# Patient Record
Sex: Male | Born: 1975 | Race: White | Hispanic: No | Marital: Single | Smoking: Current every day smoker
Health system: Southern US, Community
[De-identification: ages and names within clinical notes are randomized; demographics above are authoritative.]

## PROBLEM LIST (undated history)

## (undated) HISTORY — PX: WISDOM TOOTH EXTRACTION: SHX21

---

## 2019-02-18 ENCOUNTER — Other Ambulatory Visit: Payer: Self-pay

## 2019-02-18 DIAGNOSIS — Z20822 Contact with and (suspected) exposure to covid-19: Secondary | ICD-10-CM

## 2019-02-23 LAB — NOVEL CORONAVIRUS, NAA: SARS-CoV-2, NAA: NOT DETECTED

## 2019-04-15 ENCOUNTER — Emergency Department (HOSPITAL_COMMUNITY): Payer: 59

## 2019-04-15 ENCOUNTER — Other Ambulatory Visit: Payer: Self-pay

## 2019-04-15 ENCOUNTER — Emergency Department (HOSPITAL_COMMUNITY)
Admission: EM | Admit: 2019-04-15 | Discharge: 2019-04-15 | Disposition: A | Discharge status: Home / Self Care | Payer: 59 | Attending: Emergency Medicine | Admitting: Emergency Medicine

## 2019-04-15 ENCOUNTER — Encounter (HOSPITAL_COMMUNITY): Payer: Self-pay | Admitting: Emergency Medicine

## 2019-04-15 DIAGNOSIS — F17210 Nicotine dependence, cigarettes, uncomplicated: Secondary | ICD-10-CM | POA: Insufficient documentation

## 2019-04-15 DIAGNOSIS — R1084 Generalized abdominal pain: Secondary | ICD-10-CM | POA: Diagnosis present

## 2019-04-15 DIAGNOSIS — K529 Noninfective gastroenteritis and colitis, unspecified: Secondary | ICD-10-CM | POA: Diagnosis not present

## 2019-04-15 DIAGNOSIS — K921 Melena: Secondary | ICD-10-CM | POA: Diagnosis not present

## 2019-04-15 DIAGNOSIS — R195 Other fecal abnormalities: Secondary | ICD-10-CM

## 2019-04-15 LAB — URINALYSIS, ROUTINE W REFLEX MICROSCOPIC
Bilirubin Urine: NEGATIVE
Glucose, UA: NEGATIVE mg/dL
Hgb urine dipstick: NEGATIVE
Ketones, ur: 20 mg/dL — AB
Leukocytes,Ua: NEGATIVE
Nitrite: NEGATIVE
Protein, ur: NEGATIVE mg/dL
Specific Gravity, Urine: >1.046 — ABNORMAL HIGH (ref 1.005–1.030)
pH: 6 (ref 5.0–8.0)

## 2019-04-15 LAB — CBC WITH DIFFERENTIAL/PLATELET
Abs Immature Granulocytes: 0.04 10*3/uL (ref 0.00–0.07)
Basophils Absolute: 0.1 10*3/uL (ref 0.0–0.1)
Basophils Relative: 0 %
Eosinophils Absolute: 0.1 10*3/uL (ref 0.0–0.5)
Eosinophils Relative: 0 %
HCT: 49.8 % (ref 39.0–52.0)
Hemoglobin: 16.7 g/dL (ref 13.0–17.0)
Immature Granulocytes: 0 %
Lymphocytes Relative: 11 %
Lymphs Abs: 1.3 10*3/uL (ref 0.7–4.0)
MCH: 30.6 pg (ref 26.0–34.0)
MCHC: 33.5 g/dL (ref 30.0–36.0)
MCV: 91.2 fL (ref 80.0–100.0)
Monocytes Absolute: 0.9 10*3/uL (ref 0.1–1.0)
Monocytes Relative: 7 %
Neutro Abs: 9.6 10*3/uL — ABNORMAL HIGH (ref 1.7–7.7)
Neutrophils Relative %: 82 %
Platelets: 306 10*3/uL (ref 150–400)
RBC: 5.46 MIL/uL (ref 4.22–5.81)
RDW: 12 % (ref 11.5–15.5)
WBC: 11.9 10*3/uL — ABNORMAL HIGH (ref 4.0–10.5)
nRBC: 0 % (ref 0.0–0.2)

## 2019-04-15 LAB — COMPREHENSIVE METABOLIC PANEL
ALT: 40 U/L (ref 0–44)
AST: 50 U/L — ABNORMAL HIGH (ref 15–41)
Albumin: 4.6 g/dL (ref 3.5–5.0)
Alkaline Phosphatase: 107 U/L (ref 38–126)
Anion gap: 11 (ref 5–15)
BUN: 7 mg/dL (ref 6–20)
CO2: 26 mmol/L (ref 22–32)
Calcium: 8.7 mg/dL — ABNORMAL LOW (ref 8.9–10.3)
Chloride: 98 mmol/L (ref 98–111)
Creatinine, Ser: 0.75 mg/dL (ref 0.61–1.24)
GFR calc Af Amer: >60 mL/min (ref >60)
GFR calc non Af Amer: >60 mL/min (ref >60)
Glucose, Bld: 109 mg/dL — ABNORMAL HIGH (ref 70–99)
Potassium: 3.9 mmol/L (ref 3.5–5.1)
Sodium: 135 mmol/L (ref 135–145)
Total Bilirubin: 0.6 mg/dL (ref 0.3–1.2)
Total Protein: 8.1 g/dL (ref 6.5–8.1)

## 2019-04-15 LAB — PROTIME-INR
INR: 0.9 (ref 0.8–1.2)
Prothrombin Time: 11.7 seconds (ref 11.4–15.2)

## 2019-04-15 LAB — LIPASE, BLOOD: Lipase: 18 U/L (ref 11–51)

## 2019-04-15 LAB — POC OCCULT BLOOD, ED: Fecal Occult Bld: POSITIVE — AB

## 2019-04-15 LAB — ETHANOL: Alcohol, Ethyl (B): <10 mg/dL (ref <10)

## 2019-04-15 MED ORDER — METRONIDAZOLE 500 MG PO TABS: 500.0000 mg | ORAL_TABLET | Freq: Three times a day (TID) | ORAL | 0 refills | Status: DC

## 2019-04-15 MED ORDER — IOHEXOL 300 MG/ML  SOLN
100.0000 mL | Freq: Once | INTRAMUSCULAR | Status: AC | PRN
  Administered 2019-04-15: 10:00:00 100 mL via INTRAVENOUS

## 2019-04-15 MED ORDER — CIPROFLOXACIN HCL 250 MG PO TABS
500.0000 mg | ORAL_TABLET | Freq: Once | ORAL | Status: AC
  Administered 2019-04-15: 14:00:00 500 mg via ORAL
  Filled 2019-04-15: qty 2

## 2019-04-15 MED ORDER — METRONIDAZOLE 500 MG PO TABS
500.0000 mg | ORAL_TABLET | Freq: Once | ORAL | Status: AC
  Administered 2019-04-15: 14:00:00 500 mg via ORAL
  Filled 2019-04-15: qty 1

## 2019-04-15 MED ORDER — PANTOPRAZOLE SODIUM 40 MG IV SOLR
40.0000 mg | Freq: Once | INTRAVENOUS | Status: AC
  Administered 2019-04-15: 09:00:00 40 mg via INTRAVENOUS
  Filled 2019-04-15: qty 40

## 2019-04-15 MED ORDER — ONDANSETRON HCL 4 MG/2ML IJ SOLN
4.0000 mg | INTRAMUSCULAR | Status: DC | PRN
  Administered 2019-04-15: 4 mg via INTRAVENOUS
  Filled 2019-04-15: qty 2

## 2019-04-15 MED ORDER — PROMETHAZINE HCL 25 MG PO TABS: 25.0000 mg | ORAL_TABLET | Freq: Four times a day (QID) | ORAL | 0 refills | Status: DC | PRN

## 2019-04-15 MED ORDER — SODIUM CHLORIDE 0.9 % IV BOLUS
1000.0000 mL | Freq: Once | INTRAVENOUS | Status: AC
  Administered 2019-04-15: 1000 mL via INTRAVENOUS

## 2019-04-15 MED ORDER — MORPHINE SULFATE (PF) 4 MG/ML IV SOLN
4.0000 mg | INTRAVENOUS | Status: AC | PRN
  Administered 2019-04-15 (×2): 4 mg via INTRAVENOUS
  Filled 2019-04-15 (×2): qty 1

## 2019-04-15 MED ORDER — CIPROFLOXACIN HCL 500 MG PO TABS: 500.0000 mg | ORAL_TABLET | Freq: Two times a day (BID) | ORAL | 0 refills | Status: DC

## 2019-04-15 NOTE — Discharge Instructions (Addendum)
Take the prescriptions as directed.  Increase your fluid intake (ie:  Gatoraide) for the next few days, as discussed.  Eat a bland diet and advance to your regular diet slowly as you can tolerate it.   Avoid full strength juices, as well as milk and milk products until your diarrhea has resolved.   Call your regular medical doctor today to schedule a follow up appointment in the next 3 days. Call the GI doctor today to schedule a follow up appointment within the next week.  Return to the Emergency Department immediately sooner if worsening.

## 2019-04-15 NOTE — ED Provider Notes (Signed)
[facility] EMERGENCY DEPARTMENT Provider Note   CSN: [id number] Arrival date & time: 04/15/19  [id number]     History   Chief Complaint Chief Complaint  Patient presents with   Abdominal Pain    HPI Gabriel Cruz is a 43 y.o. male.     HPI Pt was seen at 0840. Per pt, c/o gradual onset and persistence of constant generalized abd "pain" that began 3 days ago. Has been associated with multiple intermittent episodes of N/V/D that began today. Has been associated with decreased appetite. Describes the stools as "black."  Endorses hx of daily etoh use. Denies CP/SOB, no back pain, no fevers, no black or blood in emesis, no blood in stools.     History reviewed. No pertinent past medical history.  There are no active problems to display for this patient.   History reviewed. No pertinent surgical history.      Home Medications    Prior to Admission medications   Not on File    Family History History reviewed. No pertinent family history.  Social History Social History   Tobacco Use   Smoking status: Current Every Day Smoker    Packs/day: 1.00    Types: Cigarettes   Smokeless tobacco: Never Used  Substance Use Topics   Alcohol use: Yes    Comment: daily beer   Drug use: Yes    Types: Marijuana    Comment: occasional     Allergies   Patient has no known allergies.   Review of Systems Review of Systems ROS: Statement: All systems negative except as marked or noted in the HPI; Constitutional: Negative for fever and chills. ; ; Eyes: Negative for eye pain, redness and discharge. ; ; ENMT: Negative for ear pain, hoarseness, nasal congestion, sinus pressure and sore throat. ; ; Cardiovascular: Negative for chest pain, palpitations, diaphoresis, dyspnea and peripheral edema. ; ; Respiratory: Negative for cough, wheezing and stridor. ; ; Gastrointestinal: +N/V/D, abd pain, "black stools." Negative for blood in stool, hematemesis, jaundice and rectal bleeding. . ;  ; Genitourinary: Negative for dysuria, flank pain and hematuria. ; ; Genital:  No penile drainage or rash, no testicular pain or swelling, no scrotal rash or swelling. ;; Musculoskeletal: Negative for back pain and neck pain. Negative for swelling and trauma.; ; Skin: Negative for pruritus, rash, abrasions, blisters, bruising and skin lesion.; ; Neuro: Negative for headache, lightheadedness and neck stiffness. Negative for weakness, altered level of consciousness, altered mental status, extremity weakness, paresthesias, involuntary movement, seizure and syncope.       Physical Exam Updated Vital Signs BP (!) 145/86    Pulse 88    Resp 18    Ht 6\' 1"  (1.854 m)    Wt 74.8 kg    SpO2 100%    BMI 21.77 kg/m    BP (!) 155/89    Pulse 84    Temp 97.9 F (36.6 C) (Oral)    Resp 14    Ht 6\' 1"  (1.854 m)    Wt 74.8 kg    SpO2 94%    BMI 21.77 kg/m    Physical Exam 0845: Physical examination:  Nursing notes reviewed; Vital signs and O2 SAT reviewed;  Constitutional: Well developed, Well nourished, Well hydrated, Uncomfortable appearing; Head:  Normocephalic, atraumatic; Eyes: EOMI, PERRL, No scleral icterus; ENMT: Mouth and pharynx normal, Mucous membranes moist; Neck: Supple, Full range of motion, No lymphadenopathy; Cardiovascular: Regular rate and rhythm, No gallop; Respiratory: Breath sounds clear & equal bilaterally, No  wheezes.  Speaking full sentences with ease, Normal respiratory effort/excursion; Chest: Nontender, Movement normal; Abdomen: Soft, +RLQ > RUQ, mid-epigastric tenderness to palp. Nondistended, Normal bowel sounds. Rectal exam performed w/permission of pt and ED RN chaperone present.  Anal tone normal.  Non-tender, scant black stool in rectal vault, heme positive.  No fissures, no external hemorrhoids, no palp masses.;;; Genitourinary: No CVA tenderness; Extremities: Peripheral pulses normal, No tenderness, No edema, No calf edema or asymmetry.; Neuro: AA&Ox3, Major CN grossly intact.   Speech clear. No gross focal motor or sensory deficits in extremities.; Skin: Color normal, Warm, Dry.     ED Treatments / Results  Labs (all labs ordered are listed, but only abnormal results are displayed)   EKG None  Radiology   Procedures Procedures (including critical care time)  Medications Ordered in ED Medications  ondansetron (ZOFRAN) injection 4 mg (4 mg Intravenous Given 04/15/19 0856)  morphine 4 MG/ML injection 4 mg (4 mg Intravenous Given 04/15/19 0859)  sodium chloride 0.9 % bolus 1,000 mL (1,000 mLs Intravenous New Bag/Given 04/15/19 0900)  pantoprazole (PROTONIX) injection 40 mg (40 mg Intravenous Given 04/15/19 0856)     Initial Impression / Assessment and Plan / ED Course  I have reviewed the triage vital signs and the nursing notes.  Pertinent labs & imaging results that were available during my care of the patient were reviewed by me and considered in my medical decision making (see chart for details).     MDM Reviewed: previous chart, nursing note and vitals Reviewed previous: labs Interpretation: labs, x-ray and CT scan   Results for orders placed or performed during the hospital encounter of 04/15/19  Protime-INR  Result Value Ref Range   Prothrombin Time 11.7 11.4 - 15.2 seconds   INR 0.9 0.8 - 1.2  CBC with Differential  Result Value Ref Range   WBC 11.9 (H) 4.0 - 10.5 K/uL   RBC 5.46 4.22 - 5.81 MIL/uL   Hemoglobin 16.7 13.0 - 17.0 g/dL   HCT 16.149.8 09.639.0 - 04.552.0 %   MCV 91.2 80.0 - 100.0 fL   MCH 30.6 26.0 - 34.0 pg   MCHC 33.5 30.0 - 36.0 g/dL   RDW 40.912.0 81.111.5 - 91.415.5 %   Platelets 306 150 - 400 K/uL   nRBC 0.0 0.0 - 0.2 %   Neutrophils Relative % 82 %   Neutro Abs 9.6 (H) 1.7 - 7.7 K/uL   Lymphocytes Relative 11 %   Lymphs Abs 1.3 0.7 - 4.0 K/uL   Monocytes Relative 7 %   Monocytes Absolute 0.9 0.1 - 1.0 K/uL   Eosinophils Relative 0 %   Eosinophils Absolute 0.1 0.0 - 0.5 K/uL   Basophils Relative 0 %   Basophils Absolute 0.1 0.0  - 0.1 K/uL   Immature Granulocytes 0 %   Abs Immature Granulocytes 0.04 0.00 - 0.07 K/uL  Comprehensive metabolic panel  Result Value Ref Range   Sodium 135 135 - 145 mmol/L   Potassium 3.9 3.5 - 5.1 mmol/L   Chloride 98 98 - 111 mmol/L   CO2 26 22 - 32 mmol/L   Glucose, Bld 109 (H) 70 - 99 mg/dL   BUN 7 6 - 20 mg/dL   Creatinine, Ser 7.820.75 0.61 - 1.24 mg/dL   Calcium 8.7 (L) 8.9 - 10.3 mg/dL   Total Protein 8.1 6.5 - 8.1 g/dL   Albumin 4.6 3.5 - 5.0 g/dL   AST 50 (H) 15 - 41 U/L   ALT 40 0 -  44 U/L   Alkaline Phosphatase 107 38 - 126 U/L   Total Bilirubin 0.6 0.3 - 1.2 mg/dL   GFR calc non Af Amer >60 >60 mL/min   GFR calc Af Amer >60 >60 mL/min   Anion gap 11 5 - 15  Ethanol  Result Value Ref Range   Alcohol, Ethyl (B) <10 <10 mg/dL  Lipase, blood  Result Value Ref Range   Lipase 18 11 - 51 U/L  Urinalysis, Routine w reflex microscopic  Result Value Ref Range   Color, Urine YELLOW YELLOW   APPearance CLEAR CLEAR   Specific Gravity, Urine >1.046 (H) 1.005 - 1.030   pH 6.0 5.0 - 8.0   Glucose, UA NEGATIVE NEGATIVE mg/dL   Hgb urine dipstick NEGATIVE NEGATIVE   Bilirubin Urine NEGATIVE NEGATIVE   Ketones, ur 20 (A) NEGATIVE mg/dL   Protein, ur NEGATIVE NEGATIVE mg/dL   Nitrite NEGATIVE NEGATIVE   Leukocytes,Ua NEGATIVE NEGATIVE  POC occult blood, ED  Result Value Ref Range   Fecal Occult Bld POSITIVE (A) NEGATIVE   Dg Chest 2 View Result Date: 04/15/2019 CLINICAL DATA:  Abdominal pain, cough, left-sided chest pain EXAM: CHEST - 2 VIEW COMPARISON:  None. FINDINGS: The heart size and mediastinal contours are within normal limits. Both lungs are clear. The visualized skeletal structures are unremarkable. IMPRESSION: No acute abnormality of the lungs. Electronically Signed   By: Eddie Candle M.D.   On: 04/15/2019 10:33   Ct Abdomen Pelvis W Contrast Result Date: 04/15/2019 CLINICAL DATA:  Acute generalized abdominal pain. EXAM: CT ABDOMEN AND PELVIS WITH CONTRAST TECHNIQUE:  Multidetector CT imaging of the abdomen and pelvis was performed using the standard protocol following bolus administration of intravenous contrast. CONTRAST:  117mL OMNIPAQUE IOHEXOL 300 MG/ML  SOLN COMPARISON:  None. FINDINGS: Lower chest: No acute abnormality. Hepatobiliary: No focal liver abnormality is seen. No gallstones, gallbladder wall thickening, or biliary dilatation. Pancreas: Unremarkable. No pancreatic ductal dilatation or surrounding inflammatory changes. Spleen: Normal in size without focal abnormality. Adrenals/Urinary Tract: Adrenal glands are unremarkable. Kidneys are normal, without renal calculi, focal lesion, or hydronephrosis. Bladder is unremarkable. Stomach/Bowel: The stomach and appendix are unremarkable. There is diffuse wall and fold thickening throughout the colon, most prominently the cecum, with minimal surrounding inflammatory changes consistent with infectious or inflammatory colitis. Vascular/Lymphatic: No significant vascular findings are present. No enlarged abdominal or pelvic lymph nodes. Reproductive: Prostate is unremarkable. Other: No abdominal wall hernia or abnormality. No abdominopelvic ascites. Musculoskeletal: No acute or significant osseous findings. IMPRESSION: Diffuse wall and fold thickening is noted throughout the colon, most prominently involving the cecum, consistent with infectious or inflammatory colitis. Electronically Signed   By: Marijo Conception M.D.   On: 04/15/2019 10:37    Gabriel Cruz was evaluated in Emergency Department on 04/15/2019 for the symptoms described in the history of present illness. He was evaluated in the context of the global COVID-19 pandemic, which necessitated consideration that the patient might be at risk for infection with the SARS-CoV-2 virus that causes COVID-19. Institutional protocols and algorithms that pertain to the evaluation of patients at risk for COVID-19 are in a state of rapid change based on information released by  regulatory bodies including the CDC and federal and state organizations. These policies and algorithms were followed during the patient's care in the ED.   1315:  Pt has tol PO well while in the ED without N/V.  No stooling while in the ED.  Abd benign, resps easy, VSS. Feels better  and wants to go home now. Pt feels he can "drink more fluids" at home vs more IVF in the ED. Strict return precautions given. Dx and testing, d/w pt.  Questions answered.  Verb understanding, agreeable to d/c home with outpt f/u.      Final Clinical Impressions(s) / ED Diagnoses   Final diagnoses:  None    ED Discharge Orders    None       Samuel Jester, DO 04/18/19 1537

## 2019-04-15 NOTE — ED Triage Notes (Signed)
Pt states he has been having generalized abd pain with nausea, vomiting, and diarrhea x 3 days. No other individuals sick. ETOH daily.

## 2020-02-21 IMAGING — DX CHEST - 2 VIEW
2 series · 2 of 2 positions shown · non-contrast
Comparison: None.

CLINICAL DATA: Abdominal pain, cough, left-sided chest pain

EXAM:
CHEST - 2 VIEW

[chest pa]
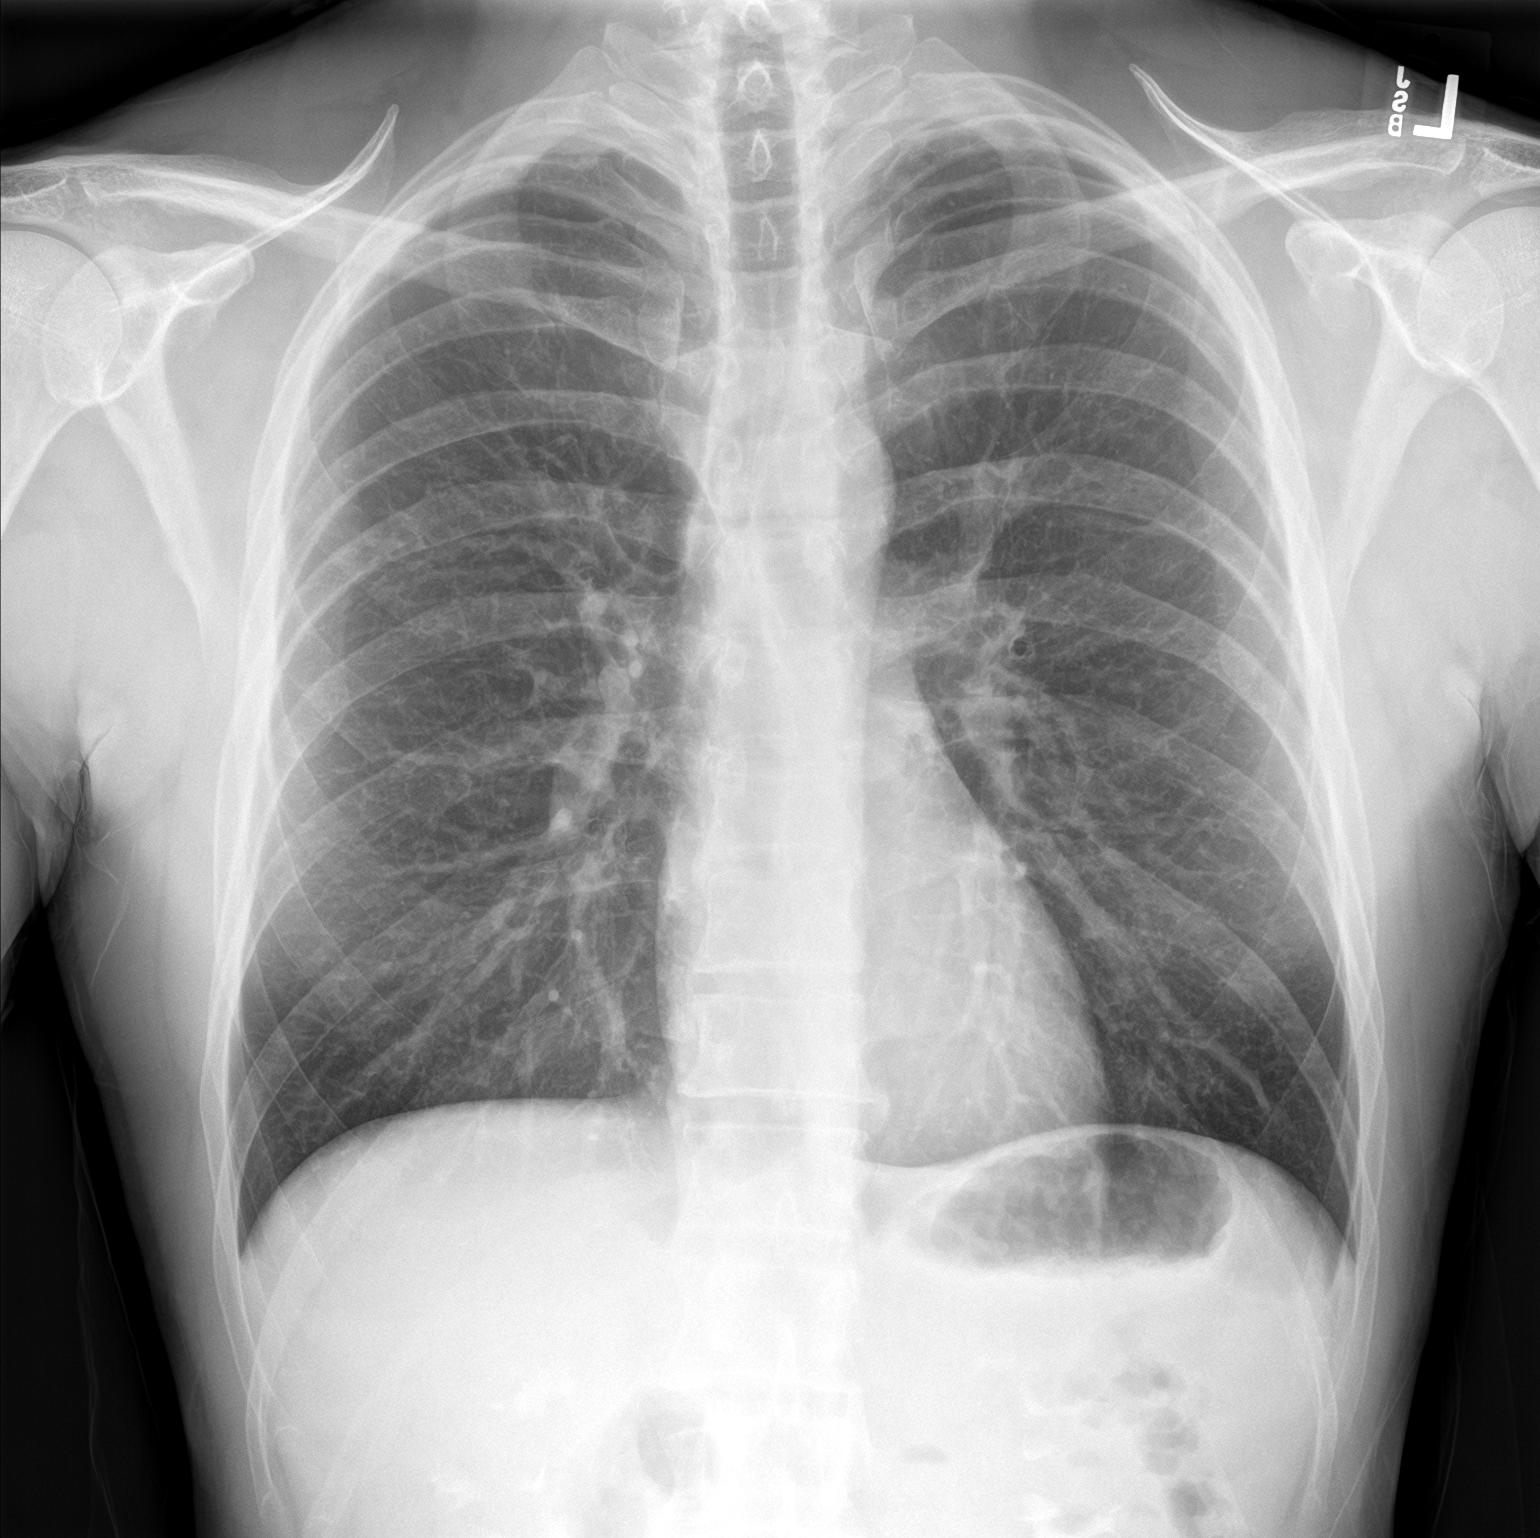

[chest lat]
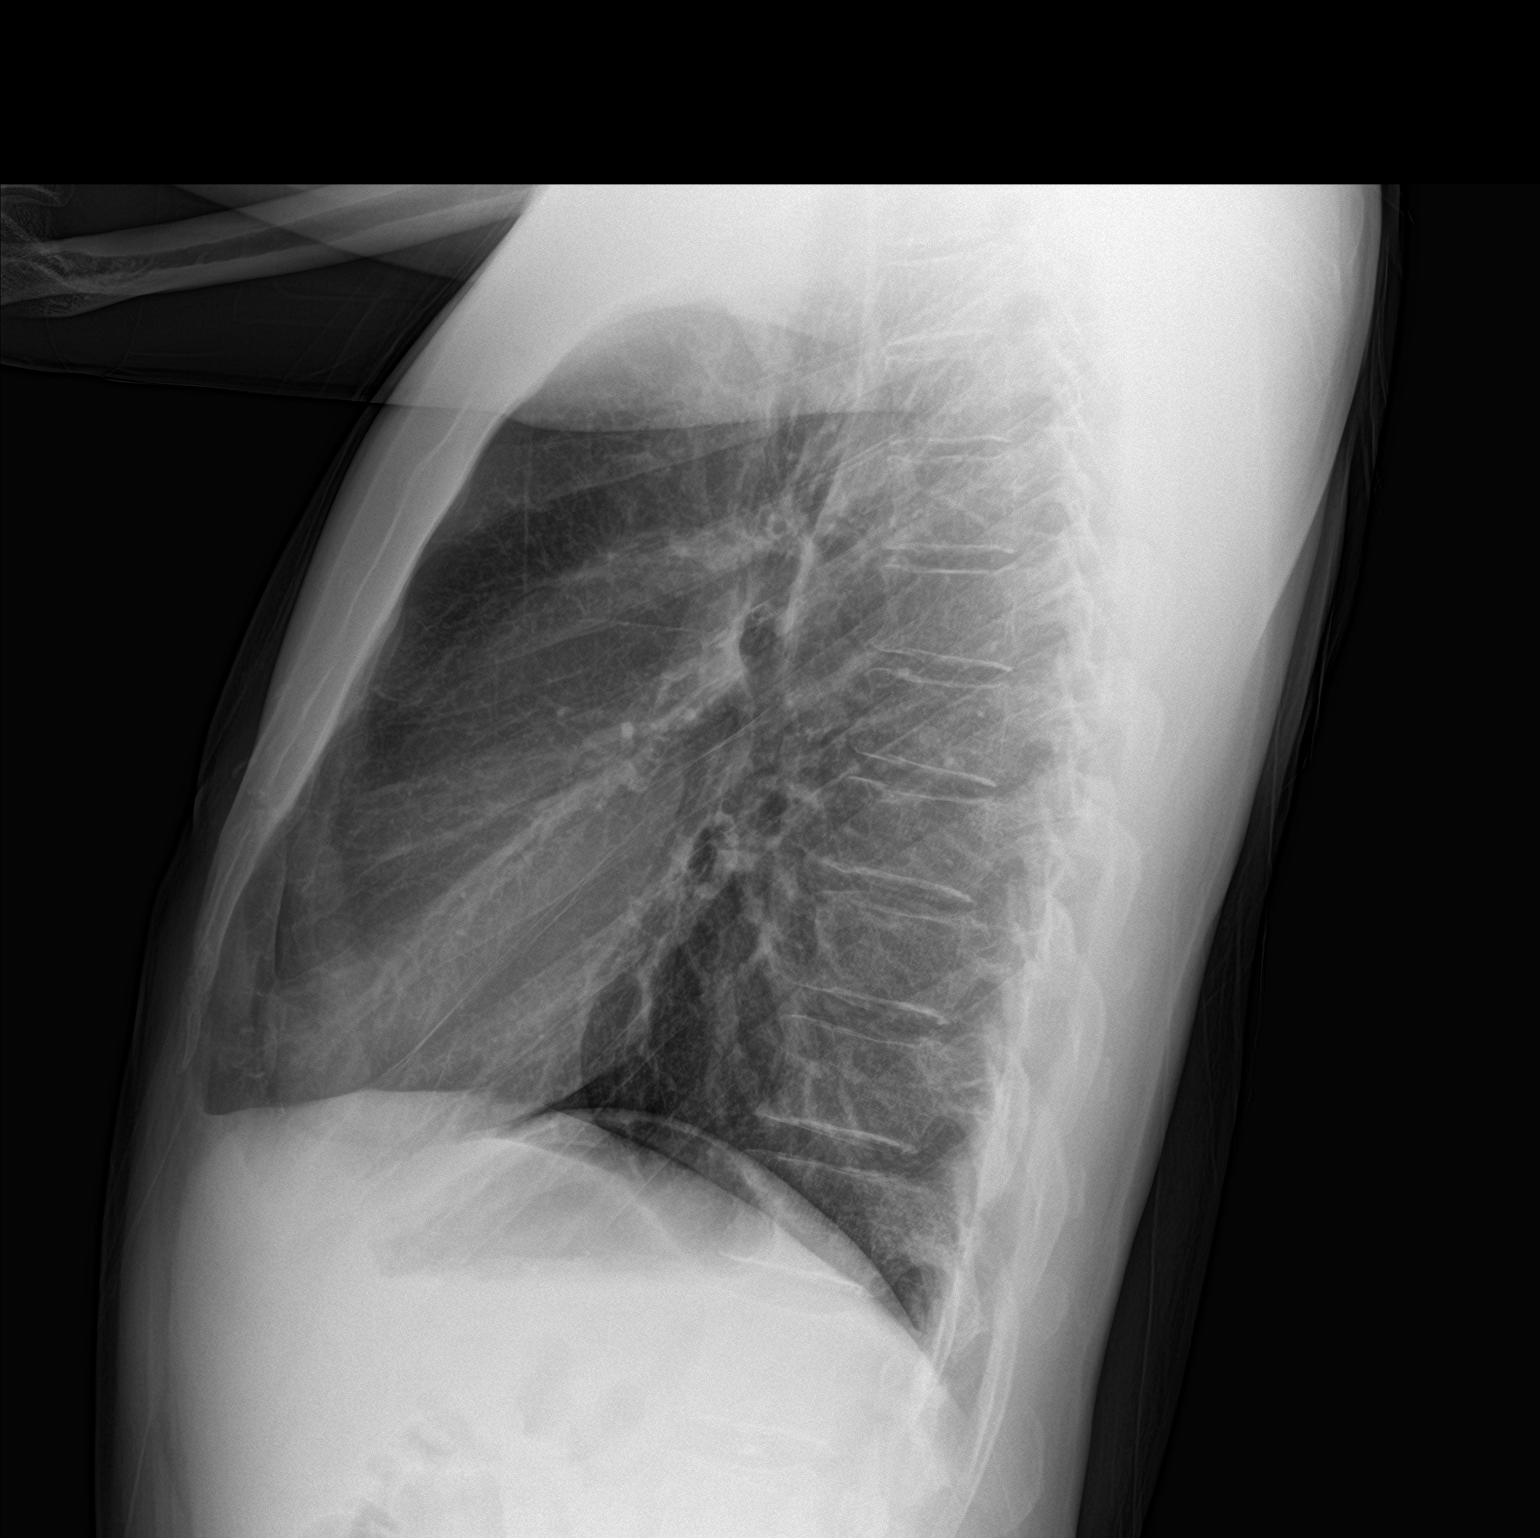

[2 of 2 positions shown; findings below may reference images not displayed]

FINDINGS: The heart size and mediastinal contours are within normal limits.
Both lungs are clear. The visualized skeletal structures are
unremarkable.
IMPRESSION: No acute abnormality of the lungs.

## 2023-12-18 ENCOUNTER — Encounter: Payer: Self-pay | Admitting: *Deleted

## 2024-01-22 ENCOUNTER — Other Ambulatory Visit: Payer: Self-pay | Admitting: *Deleted

## 2024-01-22 DIAGNOSIS — Z1211 Encounter for screening for malignant neoplasm of colon: Secondary | ICD-10-CM

## 2024-01-23 ENCOUNTER — Ambulatory Visit (INDEPENDENT_AMBULATORY_CARE_PROVIDER_SITE_OTHER): Payer: MEDICAID | Admitting: General Surgery

## 2024-01-23 ENCOUNTER — Encounter: Payer: Self-pay | Admitting: General Surgery

## 2024-01-23 VITALS — BP 122/80 | HR 82 | Temp 98.1°F | Resp 16 | Ht 73.0 in | Wt 160.0 lb

## 2024-01-23 DIAGNOSIS — Z1211 Encounter for screening for malignant neoplasm of colon: Secondary | ICD-10-CM | POA: Diagnosis not present

## 2024-01-23 MED ORDER — SUTAB 1479-225-188 MG PO TABS: 24.0000 | ORAL_TABLET | Freq: Once | ORAL | 0 refills | Status: AC

## 2024-01-23 NOTE — Progress Notes (Signed)
 Gabriel Cruz; [id number]; Mar 13, 1976   HPI Patient is a 48 year old white male who was referred to my care by Dr. Abel Abelson for a screening colonoscopy.  Patient has never had a colonoscopy.  He has no immediate relatives with colon cancer, but distant relatives have been diagnosed with colon cancer.  He denies any abdominal pain, abnormal weight gain or loss, blood per rectum, or abnormal diarrhea or constipation. History reviewed. No pertinent past medical history.  History reviewed. No pertinent surgical history.  History reviewed. No pertinent family history.  No current outpatient medications on file prior to visit.   No current facility-administered medications on file prior to visit.    No Known Allergies  Social History   Substance and Sexual Activity  Alcohol Use Not Currently   Comment: daily beer    Social History   Tobacco Use  Smoking Status Every Day   Current packs/day: 1.00   Types: Cigarettes  Smokeless Tobacco Never    Review of Systems  Constitutional: Negative.   HENT: Negative.    Eyes:  Positive for blurred vision.  Respiratory: Negative.    Cardiovascular: Negative.   Gastrointestinal: Negative.   Genitourinary: Negative.   Musculoskeletal: Negative.   Skin: Negative.   Neurological: Negative.   Endo/Heme/Allergies: Negative.   Psychiatric/Behavioral: Negative.      Objective   Vitals:   01/23/24 1331  BP: 122/80  Pulse: 82  Resp: 16  Temp: 98.1 F (36.7 C)  SpO2: 94%    Physical Exam Vitals reviewed.  Constitutional:      Appearance: Normal appearance. He is normal weight. He is not ill-appearing.  HENT:     Head: Normocephalic and atraumatic.  Cardiovascular:     Rate and Rhythm: Normal rate and regular rhythm.     Heart sounds: Normal heart sounds. No murmur heard.    No friction rub. No gallop.  Pulmonary:     Effort: Pulmonary effort is normal. No respiratory distress.     Breath sounds: Normal breath sounds. No  stridor. No wheezing, rhonchi or rales.  Abdominal:     General: Abdomen is flat. Bowel sounds are normal. There is no distension.     Palpations: Abdomen is soft. There is no mass.     Tenderness: There is no abdominal tenderness. There is no guarding or rebound.     Hernia: No hernia is present.  Skin:    General: Skin is warm and dry.  Neurological:     Mental Status: He is alert and oriented to person, place, and time.   Primary care notes reviewed  Assessment  Need for screening colonoscopy Plan  Patient is scheduled for screening colonoscopy on 02/13/2024.  The risks and benefits of the procedure including bleeding and perforation were fully explained to the patient, who gave informed consent.  Sutabs have been prescribed preoperatively for bowel preparation.

## 2024-01-23 NOTE — H&P (Signed)
 Gabriel Cruz; [id number]; Mar 13, 1976   HPI Patient is a 48 year old white male who was referred to my care by Dr. Abel Abelson for a screening colonoscopy.  Patient has never had a colonoscopy.  He has no immediate relatives with colon cancer, but distant relatives have been diagnosed with colon cancer.  He denies any abdominal pain, abnormal weight gain or loss, blood per rectum, or abnormal diarrhea or constipation. History reviewed. No pertinent past medical history.  History reviewed. No pertinent surgical history.  History reviewed. No pertinent family history.  No current outpatient medications on file prior to visit.   No current facility-administered medications on file prior to visit.    No Known Allergies  Social History   Substance and Sexual Activity  Alcohol Use Not Currently   Comment: daily beer    Social History   Tobacco Use  Smoking Status Every Day   Current packs/day: 1.00   Types: Cigarettes  Smokeless Tobacco Never    Review of Systems  Constitutional: Negative.   HENT: Negative.    Eyes:  Positive for blurred vision.  Respiratory: Negative.    Cardiovascular: Negative.   Gastrointestinal: Negative.   Genitourinary: Negative.   Musculoskeletal: Negative.   Skin: Negative.   Neurological: Negative.   Endo/Heme/Allergies: Negative.   Psychiatric/Behavioral: Negative.      Objective   Vitals:   01/23/24 1331  BP: 122/80  Pulse: 82  Resp: 16  Temp: 98.1 F (36.7 C)  SpO2: 94%    Physical Exam Vitals reviewed.  Constitutional:      Appearance: Normal appearance. He is normal weight. He is not ill-appearing.  HENT:     Head: Normocephalic and atraumatic.  Cardiovascular:     Rate and Rhythm: Normal rate and regular rhythm.     Heart sounds: Normal heart sounds. No murmur heard.    No friction rub. No gallop.  Pulmonary:     Effort: Pulmonary effort is normal. No respiratory distress.     Breath sounds: Normal breath sounds. No  stridor. No wheezing, rhonchi or rales.  Abdominal:     General: Abdomen is flat. Bowel sounds are normal. There is no distension.     Palpations: Abdomen is soft. There is no mass.     Tenderness: There is no abdominal tenderness. There is no guarding or rebound.     Hernia: No hernia is present.  Skin:    General: Skin is warm and dry.  Neurological:     Mental Status: He is alert and oriented to person, place, and time.   Primary care notes reviewed  Assessment  Need for screening colonoscopy Plan  Patient is scheduled for screening colonoscopy on 02/13/2024.  The risks and benefits of the procedure including bleeding and perforation were fully explained to the patient, who gave informed consent.  Sutabs have been prescribed preoperatively for bowel preparation.

## 2024-02-13 ENCOUNTER — Ambulatory Visit (HOSPITAL_COMMUNITY): Payer: MEDICAID

## 2024-02-13 ENCOUNTER — Other Ambulatory Visit: Payer: Self-pay

## 2024-02-13 ENCOUNTER — Ambulatory Visit (HOSPITAL_COMMUNITY)
Admission: RE | Admit: 2024-02-13 | Discharge: 2024-02-13 | Disposition: A | Discharge status: Home / Self Care | Payer: MEDICAID | Attending: General Surgery | Admitting: General Surgery

## 2024-02-13 ENCOUNTER — Encounter (HOSPITAL_COMMUNITY)
Admission: RE | Disposition: A | Discharge status: Home / Self Care | Payer: Self-pay | Source: Home / Self Care | Attending: General Surgery

## 2024-02-13 ENCOUNTER — Encounter (HOSPITAL_COMMUNITY): Payer: Self-pay | Admitting: General Surgery

## 2024-02-13 DIAGNOSIS — Z1211 Encounter for screening for malignant neoplasm of colon: Secondary | ICD-10-CM | POA: Diagnosis not present

## 2024-02-13 DIAGNOSIS — F17210 Nicotine dependence, cigarettes, uncomplicated: Secondary | ICD-10-CM | POA: Diagnosis not present

## 2024-02-13 DIAGNOSIS — H538 Other visual disturbances: Secondary | ICD-10-CM | POA: Diagnosis not present

## 2024-02-13 HISTORY — PX: COLONOSCOPY: SHX5424

## 2024-02-13 SURGERY — COLONOSCOPY
Anesthesia: General

## 2024-02-13 MED ORDER — LACTATED RINGERS IV SOLN: INTRAVENOUS | Status: DC

## 2024-02-13 MED ORDER — PHENYLEPHRINE 80 MCG/ML (10ML) SYRINGE FOR IV PUSH (FOR BLOOD PRESSURE SUPPORT)
PREFILLED_SYRINGE | INTRAVENOUS | Status: DC | PRN
Start: 2024-02-13 — End: 2024-02-13
  Administered 2024-02-13 (×4): 80 ug via INTRAVENOUS

## 2024-02-13 MED ORDER — LIDOCAINE 2% (20 MG/ML) 5 ML SYRINGE
INTRAMUSCULAR | Status: DC | PRN
  Administered 2024-02-13: 60 mg via INTRAVENOUS

## 2024-02-13 MED ORDER — PROPOFOL 10 MG/ML IV BOLUS
INTRAVENOUS | Status: DC | PRN
  Administered 2024-02-13: 30 mg via INTRAVENOUS
  Administered 2024-02-13: 80 mg via INTRAVENOUS
  Administered 2024-02-13: 150 ug/kg/min via INTRAVENOUS

## 2024-02-13 NOTE — Transfer of Care (Signed)
 Immediate Anesthesia Transfer of Care Note  Patient: Ludie Pavlik  Procedure(s) Performed: COLONOSCOPY  Patient Location: Endoscopy Unit  Anesthesia Type:General  Level of Consciousness: drowsy and patient cooperative  Airway & Oxygen Therapy: Patient Spontanous Breathing  Post-op Assessment: Report given to RN and Post -op Vital signs reviewed and stable  Post vital signs: Reviewed and stable  Last Vitals:  Vitals Value Taken Time  BP 110/51 02/13/24 07:46  Temp 36.7 C 02/13/24 07:46  Pulse 75 02/13/24 07:46  Resp 14 02/13/24 07:46  SpO2 96 % 02/13/24 07:46    Last Pain:  Vitals:   02/13/24 0746  TempSrc: Axillary  PainSc: 0-No pain      Patients Stated Pain Goal: 10 (02/13/24 0706)  Complications: No notable events documented.

## 2024-02-13 NOTE — Interval H&P Note (Signed)
 History and Physical Interval Note:  02/13/2024 7:08 AM  Gabriel Cruz  has presented today for surgery, with the diagnosis of SCREENING FOR COLON CANCER.  The various methods of treatment have been discussed with the patient and family. After consideration of risks, benefits and other options for treatment, the patient has consented to  Procedure(s) with comments: COLONOSCOPY (N/A) - W/ PROPOFOL as a surgical intervention.  The patient's history has been reviewed, patient examined, no change in status, stable for surgery.  I have reviewed the patient's chart and labs.  Questions were answered to the patient's satisfaction.     Oneil Budge

## 2024-02-13 NOTE — Anesthesia Preprocedure Evaluation (Addendum)
 Anesthesia Evaluation  Patient identified by MRN, date of birth, ID band Patient awake    Reviewed: Allergy & Precautions, H&P , NPO status , Patient's Chart, lab work & pertinent test results  Airway Mallampati: II  TM Distance: >3 FB Neck ROM: Full    Dental no notable dental hx.    Pulmonary Current Smoker   Pulmonary exam normal breath sounds clear to auscultation       Cardiovascular negative cardio ROS Normal cardiovascular exam Rhythm:Regular Rate:Normal     Neuro/Psych negative neurological ROS  negative psych ROS   GI/Hepatic negative GI ROS, Neg liver ROS,,,  Endo/Other  negative endocrine ROS    Renal/GU negative Renal ROS  negative genitourinary   Musculoskeletal negative musculoskeletal ROS (+)    Abdominal   Peds negative pediatric ROS (+)  Hematology negative hematology ROS (+)   Anesthesia Other Findings   Reproductive/Obstetrics negative OB ROS                             Anesthesia Physical Anesthesia Plan  ASA: 2  Anesthesia Plan: General   Post-op Pain Management:    Induction: Intravenous  PONV Risk Score and Plan: Propofol infusion  Airway Management Planned: Nasal Cannula  Additional Equipment:   Intra-op Plan:   Post-operative Plan:   Informed Consent: I have reviewed the patients History and Physical, chart, labs and discussed the procedure including the risks, benefits and alternatives for the proposed anesthesia with the patient or authorized representative who has indicated his/her understanding and acceptance.     Dental advisory given  Plan Discussed with: CRNA  Anesthesia Plan Comments:        Anesthesia Quick Evaluation

## 2024-02-13 NOTE — Op Note (Signed)
 [facility] Patient Name: Gabriel Cruz Procedure Date: 02/13/2024 7:14 AM MRN: [id number] Date of Birth: [birth year] Attending MD: Oneil Budge , MD, [id number] CSN: [id number] Age: 48 Admit Type: Outpatient Procedure:                Colonoscopy Indications:              Screening for colorectal malignant neoplasm Providers:                Oneil Budge, MD, Jon [employer]. Museum/gallery exhibitions officer, RN, [employer], RN, Kristine L. Shirlean Balm, Technician Referring MD:             Oneil Budge, MD Medicines:                Propofol per Anesthesia Complications:            No immediate complications. Estimated Blood Loss:     Estimated blood loss: none. Procedure:                Pre-Anesthesia Assessment:                           - Prior to the procedure, a History and Physical                            was performed, and patient medications and                            allergies were reviewed. The patient is competent.                            The risks and benefits of the procedure and the                            sedation options and risks were discussed with the                            patient. All questions were answered and informed                            consent was obtained. Patient identification and                            proposed procedure were verified by the physician,                            the nurse, the anesthesiologist, the anesthetist                            and the technician in the endoscopy suite. Mental                            Status Examination: alert and oriented. Airway  Examination: normal oropharyngeal airway and neck                            mobility. Respiratory Examination: clear to                            auscultation. CV Examination: RRR, no murmurs, no                            S3 or S4. Prophylactic Antibiotics: The patient                            does not require prophylactic  antibiotics. Prior                            Anticoagulants: The patient has taken no                            anticoagulant or antiplatelet agents. ASA Grade                            Assessment: II - A patient with mild systemic                            disease. After reviewing the risks and benefits,                            the patient was deemed in satisfactory condition to                            undergo the procedure. The anesthesia plan was to                            use deep sedation / analgesia. Immediately prior to                            administration of medications, the patient was                            re-assessed for adequacy to receive sedatives. The                            heart rate, respiratory rate, oxygen saturations,                            blood pressure, adequacy of pulmonary ventilation,                            and response to care were monitored throughout the                            procedure. The physical status of the patient was  re-assessed after the procedure.                           After obtaining informed consent, the colonoscope                            was passed under direct vision. Throughout the                            procedure, the patient's blood pressure, pulse, and                            oxygen saturations were monitored continuously. The                            [id number]) scope was introduced through the                            anus and advanced to the the cecum, identified by                            the appendiceal orifice, ileocecal valve and                            palpation. No anatomical landmarks were                            photographed. The entire colon was well visualized.                            The colonoscopy was performed without difficulty.                            The patient tolerated the procedure well. The                             quality of the bowel preparation was adequate. The                            total duration of the procedure was 14 minutes. Scope In: 7:29:19 AM Scope Out: 7:43:07 AM Scope Withdrawal Time: 0 hours 5 minutes 14 seconds  Total Procedure Duration: 0 hours 13 minutes 48 seconds  Findings:      The perianal and digital rectal examinations were normal.      The entire examined colon appeared normal on direct and retroflexion       views. Impression:               - The entire examined colon is normal on direct and                            retroflexion views.                           - No specimens collected. Moderate Sedation:      Moderate (conscious)  sedation was administered by the nurse and       supervised by the endoscopist. The patient's oxygen saturation, heart       rate, blood pressure and response to care were monitored. Recommendation:           - Written discharge instructions were provided to                            the patient.                           - The signs and symptoms of potential delayed                            complications were discussed with the patient.                           - Patient has a contact number available for                            emergencies.                           - Return to normal activities tomorrow.                           - Resume previous diet.                           - Continue present medications.                           - Repeat colonoscopy in 10 years for screening                            purposes. Procedure Code(s):        --- Professional ---                           [phone], Colonoscopy, flexible; diagnostic, including                            collection of specimen(s) by brushing or washing,                            when performed (separate procedure) Diagnosis Code(s):        --- Professional ---                           Z12.11, Encounter for screening for malignant                             neoplasm of colon CPT copyright 2022 [employer]. All rights reserved. The codes documented in this report are preliminary and upon coder review may  be revised to meet current compliance requirements. Oneil Budge, MD Oneil Budge, MD 02/13/2024 7:46:35 AM This report has been signed electronically. Number of Addenda: 0

## 2024-02-13 NOTE — Anesthesia Postprocedure Evaluation (Signed)
 Anesthesia Post Note  Patient: Gabriel Cruz  Procedure(s) Performed: COLONOSCOPY  Patient location during evaluation: PACU Anesthesia Type: General Level of consciousness: awake and alert Pain management: pain level controlled Vital Signs Assessment: post-procedure vital signs reviewed and stable Respiratory status: spontaneous breathing, nonlabored ventilation, respiratory function stable and patient connected to nasal cannula oxygen Cardiovascular status: stable and blood pressure returned to baseline Postop Assessment: no apparent nausea or vomiting Anesthetic complications: no  No notable events documented.   Last Vitals:  Vitals:   02/13/24 0706 02/13/24 0746  BP: 110/78 (!) 110/51  Pulse: 80 75  Resp: 18 14  Temp: 36.7 C 36.7 C  SpO2: 98% 96%    Last Pain:  Vitals:   02/13/24 0746  TempSrc: Axillary  PainSc: 0-No pain                 Andrea Limes

## 2024-02-14 ENCOUNTER — Encounter (HOSPITAL_COMMUNITY): Payer: Self-pay | Admitting: General Surgery

## 2024-04-16 ENCOUNTER — Ambulatory Visit (INDEPENDENT_AMBULATORY_CARE_PROVIDER_SITE_OTHER): Payer: MEDICAID | Admitting: Podiatry

## 2024-04-16 DIAGNOSIS — M216X2 Other acquired deformities of left foot: Secondary | ICD-10-CM

## 2024-04-16 NOTE — Progress Notes (Signed)
  Subjective:  Patient ID: Gabriel Cruz, male    DOB: [birth year],  MRN: [id number]  Chief Complaint  Patient presents with   Callouses    Left foot. No pain. Noticeable when walking.    48 y.o. male presents with the above complaint.  Patient presents with left third metatarsal porokeratotic lesion with underlying plantarflexed third metatarsal painful to touch is progressive gotten worse worse with ambulation or shoe pressure she would like to discuss treatment options for this has not seen MRIs prior to seeing me.  Denies any other acute complaints.  Pain scale 7 out of 10   Review of Systems: Negative except as noted in the HPI. Denies N/V/F/Ch.  No past medical history on file. No current outpatient medications on file.  Social History   Tobacco Use  Smoking Status Every Day   Current packs/day: 1.00   Types: Cigarettes  Smokeless Tobacco Never    No Known Allergies Objective:  There were no vitals filed for this visit. There is no height or weight on file to calculate BMI. Constitutional Well developed. Well nourished.  Vascular Dorsalis pedis pulses palpable bilaterally. Posterior tibial pulses palpable bilaterally. Capillary refill normal to all digits.  No cyanosis or clubbing noted. Pedal hair growth normal.  Neurologic Normal speech. Oriented to person, place, and time. Epicritic sensation to light touch grossly present bilaterally.  Dermatologic Left third metatarsal submet 5 3 lesion noted.  Pain on palpation submet 3 lesion.  Plantarflexed third metatarsal noted.  Orthopedic: Normal joint ROM without pain or crepitus bilaterally. No visible deformities. No bony tenderness.   Radiographs: None Assessment:   1. Plantar flexed metatarsal bone of left foot    Plan:  Patient was evaluated and treated and all questions answered.  Left third metatarsal plantarflexed - All questions and concerns were discussed with the patient in extensive detail given the  presence of plantarflexed metatarsal should benefit aggressive debridement of the lesion using chisel blade the lesion was debrided to healthy stripe tissue no complication noted no pinpoint bleeding noted - He will benefit from plantarflexed third metatarsal. - Shoe gear modification discussed  No follow-ups on file.
